# Patient Record
Sex: Female | Born: 1975
Health system: Southern US, Community
[De-identification: ages and names within clinical notes are randomized; demographics above are authoritative.]

## PROBLEM LIST (undated history)

## (undated) DIAGNOSIS — Z789 Other specified health status: Secondary | ICD-10-CM

## (undated) HISTORY — DX: Other specified health status: Z78.9

## (undated) HISTORY — PX: NO PAST SURGERIES: SHX2092

---

## 2018-04-06 LAB — HM MAMMOGRAPHY

## 2018-10-25 DIAGNOSIS — C44712 Basal cell carcinoma of skin of right lower limb, including hip: Secondary | ICD-10-CM | POA: Diagnosis not present

## 2018-10-25 DIAGNOSIS — L820 Inflamed seborrheic keratosis: Secondary | ICD-10-CM | POA: Diagnosis not present

## 2018-10-25 DIAGNOSIS — C441121 Basal cell carcinoma of skin of right upper eyelid, including canthus: Secondary | ICD-10-CM | POA: Diagnosis not present

## 2018-10-25 DIAGNOSIS — D485 Neoplasm of uncertain behavior of skin: Secondary | ICD-10-CM | POA: Diagnosis not present

## 2018-11-29 DIAGNOSIS — D2271 Melanocytic nevi of right lower limb, including hip: Secondary | ICD-10-CM | POA: Diagnosis not present

## 2018-11-29 DIAGNOSIS — D225 Melanocytic nevi of trunk: Secondary | ICD-10-CM | POA: Diagnosis not present

## 2018-11-29 DIAGNOSIS — D2371 Other benign neoplasm of skin of right lower limb, including hip: Secondary | ICD-10-CM | POA: Diagnosis not present

## 2018-11-29 DIAGNOSIS — C44712 Basal cell carcinoma of skin of right lower limb, including hip: Secondary | ICD-10-CM | POA: Diagnosis not present

## 2018-11-29 DIAGNOSIS — C441122 Basal cell carcinoma of skin of right lower eyelid, including canthus: Secondary | ICD-10-CM | POA: Diagnosis not present

## 2019-07-28 DIAGNOSIS — Z85828 Personal history of other malignant neoplasm of skin: Secondary | ICD-10-CM | POA: Insufficient documentation

## 2019-07-28 DIAGNOSIS — C441122 Basal cell carcinoma of skin of right lower eyelid, including canthus: Secondary | ICD-10-CM | POA: Diagnosis not present

## 2020-08-20 NOTE — Progress Notes (Signed)
New patient visit   Patient: Sarah Mathis   DOB: 1976/02/11   44 y.o. Female  MRN: [id number] Visit Date: 08/21/2020  Today's healthcare provider: Marcille Buffy, FNP   Chief Complaint  Patient presents with  . New Patient (Initial Visit)   Subjective    Sarah Mathis is a 44 y.o. female who presents today as a new patient to establish care.  HPI  Patient reports that she has moved to [facility] from [place], she states that she feels well today and has no concerns to address. Patient reports that she follows a well balanced diet and exercises 7x a week. Patient states sleep patterns are well sleeping average 6-8hrs a night.   Overdue for mammogram. Denies any previous abnormal's. Records request obtained.   Discussed colonoscopy and age 32 years old change in guideline, not interested at this time. Information and pamphlet given on Cologuard, Declines order for either at this time. Discussed risk versus benefits.   Overdue for PAP she believes. She does have IUD.  Records requested.   Blood pressure incidentally low end normal at visit today, denies any associated symptoms - she is fasting and not had anything to eat or drink this morning. Denies any history other than occasionally lower blood pressure with fasting and fluid restrictions.  Denies any vaginal bleeding, not having menstruals with IUD.  Denies any rectal bleeding or pain.  Patient  denies any fever, body aches,chills, rash, chest pain, shortness of breath, nausea, vomiting, or diarrhea.  Denies dizziness, lightheadedness, pre syncopal or syncopal episodes.   Up to date on yearly eye exam.   Desires influenza vaccine today.  Had Pfizer vaccine for covid second dose was three months ago.  Has two children 54 and 13. Has lived in [place] for two years.   History of basal cell carcinoma right side of face outer corner of eye has been removed., sees dermatology for regular skin checks.   She denies any other concerns.     History reviewed. No pertinent past medical history.  Family Status  Relation Name Status  . Father  (Not Specified)  . Mat Aunt  (Not Specified)   Family History  Problem Relation Age of Onset  . Other Father 54       Pacemaker, Triple Bypass  . Breast cancer Maternal Aunt    Social History   Socioeconomic History  . Marital status: Married    Spouse name: Not on file  . Number of children: Not on file  . Years of education: Not on file  . Highest education level: Not on file  Occupational History  . Not on file  Tobacco Use  . Smoking status: Never Smoker  . Smokeless tobacco: Never Used  Substance and Sexual Activity  . Alcohol use: Not Currently  . Drug use: Never  . Sexual activity: Not on file  Other Topics Concern  . Not on file  Social History Narrative  . Not on file   Social Determinants of Health   Financial Resource Strain:   . Difficulty of Paying Living Expenses: Not on file  Food Insecurity:   . Worried About Charity fundraiser in the Last Year: Not on file  . Ran Out of Food in the Last Year: Not on file  Transportation Needs:   . Lack of Transportation (Medical): Not on file  . Lack of Transportation (Non-Medical): Not on file  Physical Activity:   . Days of Exercise per Week: Not on  file  . Minutes of Exercise per Session: Not on file  Stress:   . Feeling of Stress : Not on file  Social Connections:   . Frequency of Communication with Friends and Family: Not on file  . Frequency of Social Gatherings with Friends and Family: Not on file  . Attends Religious Services: Not on file  . Active Member of Clubs or Organizations: Not on file  . Attends Archivist Meetings: Not on file  . Marital Status: Not on file   No outpatient medications prior to visit.   No facility-administered medications prior to visit.   No Known Allergies  Immunization History  Administered Date(s) Administered  . Influenza,inj,Quad PF,6+ Mos  08/21/2020    Health Maintenance  Topic Date Due  . Hepatitis C Screening  Never done  . COVID-19 Vaccine (1) Never done  . HIV Screening  Never done  . TETANUS/TDAP  Never done  . PAP SMEAR-Modifier  Never done  . INFLUENZA VACCINE  Completed    Patient Care Team: Vernard Gram, Kelby Aline, FNP as PCP - General (Family Medicine)  Review of Systems  Constitutional: Negative.   HENT: Negative.   Eyes: Negative.   Respiratory: Negative.   Cardiovascular: Negative.   Gastrointestinal: Negative.   Endocrine: Negative.   Genitourinary: Negative.   Musculoskeletal: Negative.   Skin: Negative.   Neurological: Negative.   Hematological: Negative.   Psychiatric/Behavioral: Negative.   All other systems reviewed and are negative.   Last CBC No results found for: WBC, HGB, HCT, MCV, MCH, RDW, PLT Last metabolic panel No results found for: GLUCOSE, NA, K, CL, CO2, BUN, CREATININE, GFRNONAA, GFRAA, CALCIUM, PHOS, PROT, ALBUMIN, LABGLOB, AGRATIO, BILITOT, ALKPHOS, AST, ALT, ANIONGAP Last lipids No results found for: CHOL, HDL, LDLCALC, LDLDIRECT, TRIG, CHOLHDL Last hemoglobin A1c No results found for: HGBA1C Last thyroid functions No results found for: TSH, T3TOTAL, T4TOTAL, THYROIDAB Last vitamin D No results found for: 25OHVITD2, 25OHVITD3, VD25OH Last vitamin B12 and Folate No results found for: VITAMINB12, FOLATE    Objective    BP 99/69   Pulse 67   Temp 98.8 F (37.1 C) (Oral)   Resp 15   Ht 5' 6"  (1.676 m)   Wt 175 lb 3.2 oz (79.5 kg)   SpO2 100%   BMI 28.28 kg/m  Physical Exam Vitals reviewed.  Constitutional:      General: She is not in acute distress.    Appearance: She is well-developed. She is not diaphoretic.     Interventions: She is not intubated. HENT:     Head: Normocephalic and atraumatic.     Right Ear: External ear normal.     Left Ear: External ear normal.     Nose: Nose normal.     Mouth/Throat:     Mouth: Mucous membranes are moist.      Pharynx: No oropharyngeal exudate or posterior oropharyngeal erythema.  Eyes:     General: Lids are normal. No scleral icterus.       Right eye: No discharge.        Left eye: No discharge.     Conjunctiva/sclera: Conjunctivae normal.     Right eye: Right conjunctiva is not injected. No exudate or hemorrhage.    Left eye: Left conjunctiva is not injected. No exudate or hemorrhage.    Pupils: Pupils are equal, round, and reactive to light.  Neck:     Thyroid: No thyroid mass or thyromegaly.     Vascular: Normal carotid pulses. No  carotid bruit, hepatojugular reflux or JVD.     Trachea: Trachea and phonation normal. No tracheal tenderness or tracheal deviation.     Meningeal: Brudzinski's sign and Kernig's sign absent.  Cardiovascular:     Rate and Rhythm: Normal rate and regular rhythm.     Pulses: Normal pulses.          Radial pulses are 2+ on the right side and 2+ on the left side.       Dorsalis pedis pulses are 2+ on the right side and 2+ on the left side.       Posterior tibial pulses are 2+ on the right side and 2+ on the left side.     Heart sounds: Normal heart sounds, S1 normal and S2 normal. Heart sounds not distant. No murmur heard.  No friction rub. No gallop.   Pulmonary:     Effort: Pulmonary effort is normal. No tachypnea, bradypnea, accessory muscle usage or respiratory distress. She is not intubated.     Breath sounds: Normal breath sounds. No stridor. No wheezing or rales.  Chest:     Chest wall: No tenderness.  Abdominal:     General: Bowel sounds are normal. There is no distension or abdominal bruit.     Palpations: Abdomen is soft. There is no shifting dullness, fluid wave, hepatomegaly, splenomegaly, mass or pulsatile mass.     Tenderness: There is no abdominal tenderness. There is no guarding or rebound.     Hernia: No hernia is present.  Musculoskeletal:        General: No tenderness or deformity. Normal range of motion.     Cervical back: Full passive range  of motion without pain, normal range of motion and neck supple. No edema, erythema or rigidity. No spinous process tenderness or muscular tenderness. Normal range of motion.  Lymphadenopathy:     Head:     Right side of head: No submental, submandibular, tonsillar, preauricular, posterior auricular or occipital adenopathy.     Left side of head: No submental, submandibular, tonsillar, preauricular, posterior auricular or occipital adenopathy.     Cervical: No cervical adenopathy.     Right cervical: No superficial, deep or posterior cervical adenopathy.    Left cervical: No superficial, deep or posterior cervical adenopathy.     Upper Body:     Right upper body: No supraclavicular or pectoral adenopathy.     Left upper body: No supraclavicular or pectoral adenopathy.  Skin:    General: Skin is warm and dry.     Capillary Refill: Capillary refill takes less than 2 seconds.     Coloration: Skin is not pale.     Findings: No abrasion, bruising, burn, ecchymosis, erythema, lesion, petechiae or rash.     Nails: There is no clubbing.  Neurological:     Mental Status: She is alert and oriented to person, place, and time.     GCS: GCS eye subscore is 4. GCS verbal subscore is 5. GCS motor subscore is 6.     Cranial Nerves: No cranial nerve deficit.     Sensory: No sensory deficit.     Motor: No tremor, atrophy, abnormal muscle tone or seizure activity.     Coordination: Coordination normal.     Gait: Gait normal.     Deep Tendon Reflexes: Reflexes are normal and symmetric. Reflexes normal. Babinski sign absent on the right side. Babinski sign absent on the left side.     Reflex Scores:  Tricep reflexes are 2+ on the right side and 2+ on the left side.      Bicep reflexes are 2+ on the right side and 2+ on the left side.      Brachioradialis reflexes are 2+ on the right side and 2+ on the left side.      Patellar reflexes are 2+ on the right side and 2+ on the left side.      Achilles  reflexes are 2+ on the right side and 2+ on the left side. Psychiatric:        Speech: Speech normal.        Behavior: Behavior normal.        Thought Content: Thought content normal.        Judgment: Judgment normal.     Depression Screen PHQ 2/9 Scores 08/21/2020  PHQ - 2 Score 0  PHQ- 9 Score 0   Results for orders placed or performed in visit on 08/21/20  POCT urinalysis dipstick  Result Value Ref Range   Color, UA yellow    Clarity, UA clear    Glucose, UA Negative Negative   Bilirubin, UA negative    Ketones, UA negative    Spec Grav, UA <=1.005 (A) 1.010 - 1.025   Blood, UA negative    pH, UA 8.0 5.0 - 8.0   Protein, UA Negative Negative   Urobilinogen, UA 0.2 0.2 or 1.0 E.U./dL   Nitrite, UA negative    Leukocytes, UA Negative Negative   Appearance     Odor      Assessment & Plan       1. Routine health maintenance  - CBC with Differential/Platelet - Comprehensive metabolic panel - Lipid panel - TSH - Ambulatory referral to Obstetrics / Gynecology  2. Screening for blood or protein in urine Urine negative for blood and protein.  - POCT urinalysis dipstick  3. Need for influenza vaccination VIS given . Vaccine given by Jimmie Molly. CMA. Possible side effects discussed. Aftercare discussed.  - Flu Vaccine QUAD 36+ mos IM  4. Breast cancer screening by mammogram Ordered given Cecil to call to schedule. Records requested for previous.  - MM Digital Screening  5. Body mass index 28.0-28.9, adult   Increase hydration. Monitor blood pressure at home, if low readings report or if any symptoms.   The patient is advised to begin progressive daily aerobic exercise program, follow a low fat, low cholesterol diet, attempt to lose weight, improve dietary compliance, use calcium 1 gram daily with Vit D, continue current medications, continue current healthy lifestyle patterns and return for routine annual checkups.  Return in about 3 months  (around 11/20/2020), or if symptoms worsen or fail to improve, for at any time for any worsening symptoms, Go to Emergency room/ urgent care if worse.     IWellington Hampshire Gage Treiber, FNP, have reviewed all documentation for this visit. The documentation on 08/21/20 for the exam, diagnosis, procedures, and orders are all accurate and complete.    Marcille Buffy, [place] [phone] (phone) [phone] (fax)  [place]

## 2020-08-21 ENCOUNTER — Ambulatory Visit (INDEPENDENT_AMBULATORY_CARE_PROVIDER_SITE_OTHER): Payer: BC Managed Care – PPO | Admitting: Adult Health

## 2020-08-21 ENCOUNTER — Encounter: Payer: Self-pay | Admitting: Adult Health

## 2020-08-21 ENCOUNTER — Other Ambulatory Visit: Payer: Self-pay

## 2020-08-21 VITALS — BP 99/69 | HR 67 | Temp 98.8°F | Resp 15 | Ht 66.0 in | Wt 175.2 lb

## 2020-08-21 DIAGNOSIS — Z1389 Encounter for screening for other disorder: Secondary | ICD-10-CM

## 2020-08-21 DIAGNOSIS — Z1231 Encounter for screening mammogram for malignant neoplasm of breast: Secondary | ICD-10-CM

## 2020-08-21 DIAGNOSIS — Z0000 Encounter for general adult medical examination without abnormal findings: Secondary | ICD-10-CM

## 2020-08-21 DIAGNOSIS — Z23 Encounter for immunization: Secondary | ICD-10-CM | POA: Insufficient documentation

## 2020-08-21 DIAGNOSIS — Z6828 Body mass index (BMI) 28.0-28.9, adult: Secondary | ICD-10-CM | POA: Insufficient documentation

## 2020-08-21 LAB — POCT URINALYSIS DIPSTICK
Bilirubin, UA: NEGATIVE
Blood, UA: NEGATIVE
Glucose, UA: NEGATIVE
Ketones, UA: NEGATIVE
Leukocytes, UA: NEGATIVE
Nitrite, UA: NEGATIVE
Protein, UA: NEGATIVE
Spec Grav, UA: <=1.005 — AB (ref 1.010–1.025)
Urobilinogen, UA: 0.2 E.U./dL
pH, UA: 8 (ref 5.0–8.0)

## 2020-08-21 NOTE — Patient Instructions (Addendum)
Call to schedule your screening mammogram. Your orders have been placed for your exam.  Let our office know if you have questions, concerns, or any difficulty scheduling.  If normal results then yearly screening mammograms are recommended unless you notice  Changes in your breast then you should schedule a follow up office visit. If abnormal results  Further imaging will be warranted and sooner follow up as determined by the radiologist at the [facility].   [facility] at [place], [place] [address]   Discussed health benefits of physical activity, and encouraged her to engage in regular exercise appropriate for her age and condition.   The patient is advised to attempt to lose weight, improve dietary compliance, use calcium 1 gram daily with Vit D and continue current healthy lifestyle patterns.   Main: [phone]     Health Maintenance, Female Adopting a healthy lifestyle and getting preventive care are important in promoting health and wellness. Ask your health care provider about:  The right schedule for you to have regular tests and exams.  Things you can do on your own to prevent diseases and keep yourself healthy. What should I know about diet, weight, and exercise? Eat a healthy diet   Eat a diet that includes plenty of vegetables, fruits, low-fat dairy products, and lean protein.  Do not eat a lot of foods that are high in solid fats, added sugars, or sodium. Maintain a healthy weight Body mass index (BMI) is used to identify weight problems. It estimates body fat based on height and weight. Your health care provider can help determine your BMI and help you achieve or maintain a healthy weight. Get regular exercise Get regular exercise. This is one of the most important things you can do for your health. Most adults should:  Exercise for at least 150 minutes each week. The exercise should increase your heart rate and  make you sweat (moderate-intensity exercise).  Do strengthening exercises at least twice a week. This is in addition to the moderate-intensity exercise.  Spend less time sitting. Even light physical activity can be beneficial. Watch cholesterol and blood lipids Have your blood tested for lipids and cholesterol at 45 years of age, then have this test every 5 years. Have your cholesterol levels checked more often if:  Your lipid or cholesterol levels are high.  You are older than 45 years of age.  You are at high risk for heart disease. What should I know about cancer screening? Depending on your health history and family history, you may need to have cancer screening at various ages. This may include screening for:  Breast cancer.  Cervical cancer.  Colorectal cancer.  Skin cancer.  Lung cancer. What should I know about heart disease, diabetes, and high blood pressure? Blood pressure and heart disease  High blood pressure causes heart disease and increases the risk of stroke. This is more likely to develop in people who have high blood pressure readings, are of African descent, or are overweight.  Have your blood pressure checked: ? Every 3-5 years if you are 32-46 years of age. ? Every year if you are 4 years old or older. Diabetes Have regular diabetes screenings. This checks your fasting blood sugar level. Have the screening done:  Once every three years after age 87 if you are at a normal weight and have a low risk for diabetes.  More often and at a younger age if you are overweight or  have a high risk for diabetes. What should I know about preventing infection? Hepatitis B If you have a higher risk for hepatitis B, you should be screened for this virus. Talk with your health care provider to find out if you are at risk for hepatitis B infection. Hepatitis C Testing is recommended for:  Everyone born from [birth year] through 1966.  Anyone with known risk factors for  hepatitis C. Sexually transmitted infections (STIs)  Get screened for STIs, including gonorrhea and chlamydia, if: ? You are sexually active and are younger than 45 years of age. ? You are older than 45 years of age and your health care provider tells you that you are at risk for this type of infection. ? Your sexual activity has changed since you were last screened, and you are at increased risk for chlamydia or gonorrhea. Ask your health care provider if you are at risk.  Ask your health care provider about whether you are at high risk for HIV. Your health care provider may recommend a prescription medicine to help prevent HIV infection. If you choose to take medicine to prevent HIV, you should first get tested for HIV. You should then be tested every 3 months for as long as you are taking the medicine. Pregnancy  If you are about to stop having your period (premenopausal) and you may become pregnant, seek counseling before you get pregnant.  Take 400 to 800 micrograms (mcg) of folic acid every day if you become pregnant.  Ask for birth control (contraception) if you want to prevent pregnancy. Osteoporosis and menopause Osteoporosis is a disease in which the bones lose minerals and strength with aging. This can result in bone fractures. If you are 13 years old or older, or if you are at risk for osteoporosis and fractures, ask your health care provider if you should:  Be screened for bone loss.  Take a calcium or vitamin D supplement to lower your risk of fractures.  Be given hormone replacement therapy (HRT) to treat symptoms of menopause. Follow these instructions at home: Lifestyle  Do not use any products that contain nicotine or tobacco, such as cigarettes, e-cigarettes, and chewing tobacco. If you need help quitting, ask your health care provider.  Do not use street drugs.  Do not share needles.  Ask your health care provider for help if you need support or information about  quitting drugs. Alcohol use  Do not drink alcohol if: ? Your health care provider tells you not to drink. ? You are pregnant, may be pregnant, or are planning to become pregnant.  If you drink alcohol: ? Limit how much you use to 0-1 drink a day. ? Limit intake if you are breastfeeding.  Be aware of how much alcohol is in your drink. In the [place] one drink equals one 12 oz bottle of beer (355 mL), one 5 oz glass of wine (148 mL), or one 1 oz glass of hard liquor (44 mL). General instructions  Schedule regular health, dental, and eye exams.  Stay current with your vaccines.  Tell your health care provider if: ? You often feel depressed. ? You have ever been abused or do not feel safe at home. Summary  Adopting a healthy lifestyle and getting preventive care are important in promoting health and wellness.  Follow your health care provider's instructions about healthy diet, exercising, and getting tested or screened for diseases.  Follow your health care provider's instructions on monitoring your cholesterol and blood pressure.  This information is not intended to replace advice given to you by your health care provider. Make sure you discuss any questions you have with your health care provider. Document Revised: 11/10/2018 Document Reviewed: 11/10/2018 Elsevier Patient Education  [place] and Cholesterol Restricted Eating Plan Getting too much fat and cholesterol in your diet may cause health problems. Choosing the right foods helps keep your fat and cholesterol at normal levels. This can keep you from getting certain diseases. Your doctor may recommend an eating plan that includes:  Total fat: ______% or less of total calories a day.  Saturated fat: ______% or less of total calories a day.  Cholesterol: less than _________mg a day.  Fiber: ______g a day. What are tips for following this plan? Meal planning  At meals, divide your plate into four equal  parts: ? Fill one-half of your plate with vegetables and green salads. ? Fill one-fourth of your plate with whole grains. ? Fill one-fourth of your plate with low-fat (lean) protein foods.  Eat fish that is high in omega-3 fats at least two times a week. This includes mackerel, tuna, sardines, and salmon.  Eat foods that are high in fiber, such as whole grains, beans, apples, broccoli, carrots, peas, and barley. General tips   Work with your doctor to lose weight if you need to.  Avoid: ? Foods with added sugar. ? Fried foods. ? Foods with partially hydrogenated oils.  Limit alcohol intake to no more than 1 drink a day for nonpregnant women and 2 drinks a day for men. One drink equals 12 oz of beer, 5 oz of wine, or 1 oz of hard liquor. Reading food labels  Check food labels for: ? Trans fats. ? Partially hydrogenated oils. ? Saturated fat (g) in each serving. ? Cholesterol (mg) in each serving. ? Fiber (g) in each serving.  Choose foods with healthy fats, such as: ? Monounsaturated fats. ? Polyunsaturated fats. ? Omega-3 fats.  Choose grain products that have whole grains. Look for the word "whole" as the first word in the ingredient list. Cooking  Cook foods using low-fat methods. These include baking, boiling, grilling, and broiling.  Eat more home-cooked foods. Eat at restaurants and buffets less often.  Avoid cooking using saturated fats, such as butter, cream, palm oil, palm kernel oil, and coconut oil. Recommended foods  Fruits  All fresh, canned (in natural juice), or frozen fruits. Vegetables  Fresh or frozen vegetables (raw, steamed, roasted, or grilled). Green salads. Grains  Whole grains, such as whole wheat or whole grain breads, crackers, cereals, and pasta. Unsweetened oatmeal, bulgur, barley, quinoa, or brown rice. Corn or whole wheat flour tortillas. Meats and other protein foods  Ground beef (85% or leaner), grass-fed beef, or beef trimmed of  fat. Skinless chicken or [place]. Ground chicken or [place]. Pork trimmed of fat. All fish and seafood. Egg whites. Dried beans, peas, or lentils. Unsalted nuts or seeds. Unsalted canned beans. Nut butters without added sugar or oil. Dairy  Low-fat or nonfat dairy products, such as skim or 1% milk, 2% or reduced-fat cheeses, low-fat and fat-free ricotta or cottage cheese, or plain low-fat and nonfat yogurt. Fats and oils  Tub margarine without trans fats. Light or reduced-fat mayonnaise and salad dressings. Avocado. Olive, canola, sesame, or safflower oils. The items listed above may not be a complete list of foods and beverages you can eat. Contact a dietitian for more information. Foods to avoid [employer]  fruit in heavy syrup. Fruit in cream or butter sauce. Fried fruit. Vegetables  Vegetables cooked in cheese, cream, or butter sauce. Fried vegetables. Grains  White bread. White pasta. White rice. Cornbread. Bagels, pastries, and croissants. Crackers and snack foods that contain trans fat and hydrogenated oils. Meats and other protein foods  Fatty cuts of meat. Ribs, chicken wings, bacon, sausage, bologna, salami, chitterlings, fatback, hot dogs, bratwurst, and packaged lunch meats. Liver and organ meats. Whole eggs and egg yolks. Chicken and [place] with skin. Fried meat. Dairy  Whole or 2% milk, cream, half-and-half, and cream cheese. Whole milk cheeses. Whole-fat or sweetened yogurt. Full-fat cheeses. Nondairy creamers and whipped toppings. Processed cheese, cheese spreads, and cheese curds. Beverages  Alcohol. Sugar-sweetened drinks such as sodas, lemonade, and fruit drinks. Fats and oils  Butter, stick margarine, lard, shortening, ghee, or bacon fat. Coconut, palm kernel, and palm oils. Sweets and desserts  Corn syrup, sugars, honey, and molasses. Candy. Jam and jelly. Syrup. Sweetened cereals. Cookies, pies, cakes, donuts, muffins, and ice cream. The items listed above may  not be a complete list of foods and beverages you should avoid. Contact a dietitian for more information. Summary  Choosing the right foods helps keep your fat and cholesterol at normal levels. This can keep you from getting certain diseases.  At meals, fill one-half of your plate with vegetables and green salads.  Eat high-fiber foods, like whole grains, beans, apples, carrots, peas, and barley.  Limit added sugar, saturated fats, alcohol, and fried foods. This information is not intended to replace advice given to you by your health care provider. Make sure you discuss any questions you have with your health care provider. Document Revised: 07/21/2018 Document Reviewed: 08/04/2017 Elsevier Patient Education  [place]. Influenza Virus Vaccine injection (Fluarix) What is this medicine? INFLUENZA VIRUS VACCINE (in floo EN zuh VAHY ruhs vak SEEN) helps to reduce the risk of getting influenza also known as the flu. This medicine may be used for other purposes; ask your health care provider or pharmacist if you have questions. COMMON BRAND NAME(S): Fluarix, Fluzone What should I tell my health care provider before I take this medicine? They need to know if you have any of these conditions:  bleeding disorder like hemophilia  fever or infection  Guillain-Barre syndrome or other neurological problems  immune system problems  infection with the human immunodeficiency virus (HIV) or AIDS  low blood platelet counts  multiple sclerosis  an unusual or allergic reaction to influenza virus vaccine, eggs, chicken proteins, latex, gentamicin, other medicines, foods, dyes or preservatives  pregnant or trying to get pregnant  breast-feeding How should I use this medicine? This vaccine is for injection into a muscle. It is given by a health care professional. A copy of Vaccine Information Statements will be given before each vaccination. Read this sheet carefully each time. The  sheet may change frequently. Talk to your pediatrician regarding the use of this medicine in children. Special care may be needed. Overdosage: If you think you have taken too much of this medicine contact a poison control center or emergency room at once. NOTE: This medicine is only for you. Do not share this medicine with others. What if I miss a dose? This does not apply. What may interact with this medicine?  chemotherapy or radiation therapy  medicines that lower your immune system like etanercept, anakinra, infliximab, and adalimumab  medicines that treat or prevent blood clots like warfarin  phenytoin  steroid medicines  like prednisone or cortisone  theophylline  vaccines This list may not describe all possible interactions. Give your health care provider a list of all the medicines, herbs, non-prescription drugs, or dietary supplements you use. Also tell them if you smoke, drink alcohol, or use illegal drugs. Some items may interact with your medicine. What should I watch for while using this medicine? Report any side effects that do not go away within 3 days to your doctor or health care professional. Call your health care provider if any unusual symptoms occur within 6 weeks of receiving this vaccine. You may still catch the flu, but the illness is not usually as bad. You cannot get the flu from the vaccine. The vaccine will not protect against colds or other illnesses that may cause fever. The vaccine is needed every year. What side effects may I notice from receiving this medicine? Side effects that you should report to your doctor or health care professional as soon as possible:  allergic reactions like skin rash, itching or hives, swelling of the face, lips, or tongue Side effects that usually do not require medical attention (report to your doctor or health care professional if they continue or are bothersome):  fever  headache  muscle aches and pains  pain,  tenderness, redness, or swelling at site where injected  weak or tired This list may not describe all possible side effects. Call your doctor for medical advice about side effects. You may report side effects to FDA at 1-800-FDA-1088. Where should I keep my medicine? This vaccine is only given in a clinic, pharmacy, doctor's office, or other health care setting and will not be stored at home. NOTE: This sheet is a summary. It may not cover all possible information. If you have questions about this medicine, talk to your doctor, pharmacist, or health care provider.  2020 Elsevier/Gold Standard (2008-06-14 09:30:40)

## 2020-08-22 LAB — CBC WITH DIFFERENTIAL/PLATELET
Basophils Absolute: 0.1 10*3/uL (ref 0.0–0.2)
Basos: 1 %
EOS (ABSOLUTE): 0.1 10*3/uL (ref 0.0–0.4)
Eos: 2 %
Hematocrit: 44.4 % (ref 34.0–46.6)
Hemoglobin: 14.6 g/dL (ref 11.1–15.9)
Immature Grans (Abs): 0 10*3/uL (ref 0.0–0.1)
Immature Granulocytes: 0 %
Lymphocytes Absolute: 1.6 10*3/uL (ref 0.7–3.1)
Lymphs: 35 %
MCH: 29.8 pg (ref 26.6–33.0)
MCHC: 32.9 g/dL (ref 31.5–35.7)
MCV: 91 fL (ref 79–97)
Monocytes Absolute: 0.6 10*3/uL (ref 0.1–0.9)
Monocytes: 12 %
Neutrophils Absolute: 2.2 10*3/uL (ref 1.4–7.0)
Neutrophils: 50 %
Platelets: 232 10*3/uL (ref 150–450)
RBC: 4.9 x10E6/uL (ref 3.77–5.28)
RDW: 12.3 % (ref 11.7–15.4)
WBC: 4.6 10*3/uL (ref 3.4–10.8)

## 2020-08-22 LAB — COMPREHENSIVE METABOLIC PANEL
ALT: 12 IU/L (ref 0–32)
AST: 12 IU/L (ref 0–40)
Albumin/Globulin Ratio: 1.9 (ref 1.2–2.2)
Albumin: 4.5 g/dL (ref 3.8–4.8)
Alkaline Phosphatase: 35 IU/L — ABNORMAL LOW (ref 44–121)
BUN/Creatinine Ratio: 14 (ref 9–23)
BUN: 10 mg/dL (ref 6–24)
Bilirubin Total: 0.6 mg/dL (ref 0.0–1.2)
CO2: 19 mmol/L — ABNORMAL LOW (ref 20–29)
Calcium: 8.7 mg/dL (ref 8.7–10.2)
Chloride: 102 mmol/L (ref 96–106)
Creatinine, Ser: 0.72 mg/dL (ref 0.57–1.00)
GFR calc Af Amer: 117 mL/min/{1.73_m2} (ref >59)
GFR calc non Af Amer: 101 mL/min/{1.73_m2} (ref >59)
Globulin, Total: 2.4 g/dL (ref 1.5–4.5)
Glucose: 87 mg/dL (ref 65–99)
Potassium: 4.4 mmol/L (ref 3.5–5.2)
Sodium: 136 mmol/L (ref 134–144)
Total Protein: 6.9 g/dL (ref 6.0–8.5)

## 2020-08-22 LAB — LIPID PANEL
Chol/HDL Ratio: 2.9 ratio (ref 0.0–4.4)
Cholesterol, Total: 194 mg/dL (ref 100–199)
HDL: 67 mg/dL (ref >39)
LDL Chol Calc (NIH): 113 mg/dL — ABNORMAL HIGH (ref 0–99)
Triglycerides: 75 mg/dL (ref 0–149)
VLDL Cholesterol Cal: 14 mg/dL (ref 5–40)

## 2020-08-22 LAB — TSH: TSH: 0.927 u[IU]/mL (ref 0.450–4.500)

## 2020-08-22 NOTE — Progress Notes (Signed)
CBC within normal limits no signs of anemia or infection.  CMP ok alkaline phosphatase mildly decreased likely normal will recheck CMP in 3 months.  LDL elevated.  Discuss lifestyle modification with patient e.g. increase exercise, fiber, fruits, vegetables, lean meat, and omega 3/fish intake and decrease saturated fat.  If patient following strict diet and exercise program already please schedule follow up appointment with primary care physician

## 2020-09-10 ENCOUNTER — Encounter: Payer: Self-pay | Admitting: Adult Health

## 2020-10-02 NOTE — Progress Notes (Addendum)
Flinchum, Sarah Aline, FNP   Chief Complaint  Patient presents with  . Pap Only  . IUD check    HPI:      [name] is a 44 y.o. No obstetric history on file. whose LMP was No LMP recorded. (Menstrual status: IUD)., presents today for NP pap and IUD check, referred by PCP. Mirena placed about 3 yrs ago. Pt is amenorrheic, no BTB/dysmen. She is sex active, no pain/bleeding. Last pap about 3 yrs ago was normal. No hx of abn pap with tx.  FH breast cancer in her mat aunt, genetic testing not indicated. Last mammo 5/19; has mammo ordered by PCP Pt aware of scr colonoscopy/cologuard options with PCP; declined both at that time Pt gets adequate calcium but not Vit D in her diet.   Past Medical History:  Diagnosis Date  . No pertinent past medical history     Past Surgical History:  Procedure Laterality Date  . NO PAST SURGERIES      Family History  Problem Relation Age of Onset  . Other Father 29       Pacemaker, Triple Bypass  . Breast cancer Maternal Aunt        27s    Social History   Socioeconomic History  . Marital status: Married    Spouse name: Not on file  . Number of children: Not on file  . Years of education: Not on file  . Highest education level: Not on file  Occupational History  . Not on file  Tobacco Use  . Smoking status: Never Smoker  . Smokeless tobacco: Never Used  Vaping Use  . Vaping Use: Never used  Substance and Sexual Activity  . Alcohol use: Not Currently  . Drug use: Never  . Sexual activity: Yes    Birth control/protection: I.U.D.    Comment: Mirena  Other Topics Concern  . Not on file  Social History Narrative  . Not on file   Social Determinants of Health   Financial Resource Strain:   . Difficulty of Paying Living Expenses: Not on file  Food Insecurity:   . Worried About Charity fundraiser in the Last Year: Not on file  . Ran Out of Food in the Last Year: Not on file  Transportation Needs:   . Lack of  Transportation (Medical): Not on file  . Lack of Transportation (Non-Medical): Not on file  Physical Activity:   . Days of Exercise per Week: Not on file  . Minutes of Exercise per Session: Not on file  Stress:   . Feeling of Stress : Not on file  Social Connections:   . Frequency of Communication with Friends and Family: Not on file  . Frequency of Social Gatherings with Friends and Family: Not on file  . Attends Religious Services: Not on file  . Active Member of Clubs or Organizations: Not on file  . Attends Archivist Meetings: Not on file  . Marital Status: Not on file  Intimate Partner Violence:   . Fear of Current or Ex-Partner: Not on file  . Emotionally Abused: Not on file  . Physically Abused: Not on file  . Sexually Abused: Not on file    Outpatient Medications Prior to Visit  Medication Sig Dispense Refill  . levonorgestrel (MIRENA) 20 MCG/24HR IUD 1 each by Intrauterine route once. Insertion October 2019     No facility-administered medications prior to visit.      ROS:  Review  of Systems  Constitutional: Negative for fatigue, fever and unexpected weight change.  Respiratory: Negative for cough, shortness of breath and wheezing.   Cardiovascular: Negative for chest pain, palpitations and leg swelling.  Gastrointestinal: Negative for blood in stool, constipation, diarrhea, nausea and vomiting.  Endocrine: Negative for cold intolerance, heat intolerance and polyuria.  Genitourinary: Negative for dyspareunia, dysuria, flank pain, frequency, genital sores, hematuria, menstrual problem, pelvic pain, urgency, vaginal bleeding, vaginal discharge and vaginal pain.  Musculoskeletal: Negative for back pain, joint swelling and myalgias.  Skin: Negative for rash.  Neurological: Negative for dizziness, syncope, light-headedness, numbness and headaches.  Hematological: Negative for adenopathy.  Psychiatric/Behavioral: Negative for agitation, confusion, sleep  disturbance and suicidal ideas. The patient is not nervous/anxious.   BREAST: No symptoms   OBJECTIVE:   Vitals:  BP 106/70   Ht 5' 6"  (1.676 m)   Wt 172 lb (78 kg)   BMI 27.76 kg/m   Physical Exam Vitals reviewed.  Constitutional:      Appearance: She is well-developed.  Pulmonary:     Effort: Pulmonary effort is normal.  Genitourinary:    General: Normal vulva.     Pubic Area: No rash.      Labia:        Right: No rash, tenderness or lesion.        Left: No rash, tenderness or lesion.      Vagina: Normal. No vaginal discharge, erythema or tenderness.     Cervix: Normal.     Uterus: Normal. Not enlarged and not tender.      Adnexa: Right adnexa normal and left adnexa normal.       Right: No mass or tenderness.         Left: No mass or tenderness.       Comments: IUD STRINGS IN CX OS Musculoskeletal:        General: Normal range of motion.     Cervical back: Normal range of motion.  Skin:    General: Skin is warm and dry.  Neurological:     General: No focal deficit present.     Mental Status: She is alert and oriented to person, place, and time.  Psychiatric:        Mood and Affect: Mood normal.        Behavior: Behavior normal.        Thought Content: Thought content normal.        Judgment: Judgment normal.     Assessment/Plan: Cervical cancer screening - Plan: Cytology - PAP  Screening for HPV (human papillomavirus) - Plan: Cytology - PAP  Encounter for routine checking of intrauterine contraceptive device (IUD)--IUD due for removal in 4 ys now.  Screening for colon cancer--colonoscopy/cologuard discussed. Pt declines for now.  Add Vit D supp   Return in about 1 year (around 10/03/2021).  Andras Grunewald B. Devesh Monforte, PA-C 10/03/2020 10:23 AM

## 2020-10-02 NOTE — Patient Instructions (Signed)
I value your feedback and entrusting [place] with your care. If you get a [facility] patient survey, I would appreciate you taking the time to let us know about your experience today. Thank you!  As of November 10, 2019, your lab results will be released to your MyChart immediately, before I even have a chance to see them. Please give me time to review them and contact you if there are any abnormalities. Thank you for your patience.

## 2020-10-03 ENCOUNTER — Encounter: Payer: Self-pay | Admitting: Obstetrics and Gynecology

## 2020-10-03 ENCOUNTER — Other Ambulatory Visit (HOSPITAL_COMMUNITY)
Admission: RE | Admit: 2020-10-03 | Discharge: 2020-10-03 | Disposition: A | Discharge status: Home / Self Care | Payer: BC Managed Care – PPO | Source: Ambulatory Visit | Attending: Obstetrics and Gynecology | Admitting: Obstetrics and Gynecology

## 2020-10-03 ENCOUNTER — Ambulatory Visit (INDEPENDENT_AMBULATORY_CARE_PROVIDER_SITE_OTHER): Payer: BC Managed Care – PPO | Admitting: Obstetrics and Gynecology

## 2020-10-03 ENCOUNTER — Other Ambulatory Visit: Payer: Self-pay

## 2020-10-03 VITALS — BP 106/70 | Ht 66.0 in | Wt 172.0 lb

## 2020-10-03 DIAGNOSIS — Z1151 Encounter for screening for human papillomavirus (HPV): Secondary | ICD-10-CM | POA: Insufficient documentation

## 2020-10-03 DIAGNOSIS — Z124 Encounter for screening for malignant neoplasm of cervix: Secondary | ICD-10-CM | POA: Insufficient documentation

## 2020-10-03 DIAGNOSIS — Z30431 Encounter for routine checking of intrauterine contraceptive device: Secondary | ICD-10-CM | POA: Diagnosis not present

## 2020-10-03 DIAGNOSIS — Z1211 Encounter for screening for malignant neoplasm of colon: Secondary | ICD-10-CM

## 2020-10-05 ENCOUNTER — Encounter: Payer: Self-pay | Admitting: Obstetrics and Gynecology

## 2020-10-05 LAB — CYTOLOGY - PAP
Comment: NEGATIVE
Diagnosis: UNDETERMINED — AB
High risk HPV: NEGATIVE

## 2021-01-07 ENCOUNTER — Other Ambulatory Visit: Payer: Self-pay

## 2021-01-07 ENCOUNTER — Ambulatory Visit
Admission: RE | Admit: 2021-01-07 | Discharge: 2021-01-07 | Disposition: A | Discharge status: Home / Self Care | Payer: BC Managed Care – PPO | Source: Ambulatory Visit | Attending: Adult Health | Admitting: Adult Health

## 2021-01-07 DIAGNOSIS — Z1231 Encounter for screening mammogram for malignant neoplasm of breast: Secondary | ICD-10-CM | POA: Diagnosis not present

## 2021-12-10 ENCOUNTER — Other Ambulatory Visit: Payer: Self-pay | Admitting: Adult Health

## 2021-12-10 DIAGNOSIS — Z1231 Encounter for screening mammogram for malignant neoplasm of breast: Secondary | ICD-10-CM

## 2022-01-14 ENCOUNTER — Other Ambulatory Visit: Payer: Self-pay

## 2022-01-14 ENCOUNTER — Ambulatory Visit
Admission: RE | Admit: 2022-01-14 | Discharge: 2022-01-14 | Disposition: A | Discharge status: Home / Self Care | Payer: BC Managed Care – PPO | Source: Ambulatory Visit | Attending: Adult Health | Admitting: Adult Health

## 2022-01-14 DIAGNOSIS — Z1231 Encounter for screening mammogram for malignant neoplasm of breast: Secondary | ICD-10-CM | POA: Diagnosis not present

## 2022-01-15 NOTE — Progress Notes (Signed)
FINDINGS: There are no findings suspicious for malignancy.  IMPRESSION: No mammographic evidence of malignancy. A result letter of this screening mammogram will be mailed directly to the patient.  RECOMMENDATION: Screening mammogram in one yea

## 2022-03-24 NOTE — Progress Notes (Addendum)
Addendum 04/17/2022: -In regards to [facility] therapy initiation: --Pt is not a candidate for any stimulant weight loss medication ie phentermine, diethylpropion, etc d/t her comorbidity of anxiety w/ physical symptoms like heart racing, palpitations. It is my medical opinion that stimulant medications would worsen this condition. --She is not a candidate for Contrave d/t her comorbidity of anxiety.  --She has actively been involved in several diet programs ie noom, optavia. She currently follows a low calorie diet and has increased her physical activity levels.   I,Sha'taria Tyson,acting as a Education administrator for [employer], PA-C.,have documented all relevant documentation on the behalf of Mikey Kirschner, PA-C,as directed by  Mikey Kirschner, PA-C while in the presence of Mikey Kirschner, PA-C.   Complete physical exam   Patient: Sarah Mathis   DOB: 02/07/1976   47 y.o. Female  MRN: [id number] Visit Date: 03/25/2022  Today's healthcare provider: Mikey Kirschner, PA-C   Cc. cpe  Subjective    Sarah Mathis is a 46 y.o. female who presents today for a complete physical exam.  She reports consuming a general and well balanced  diet.  The patient goes walking with strength/light weight exercises at least 6 days a week for 30 minutes to a hour with mild to moderate intensity.  She generally feels well. She reports sleeping well. She does have additional problems to discuss today.  HPI   Weight gain -15 pounds in the last year despite using various diets, diet plans, supplements including optavia, noom, following different strict diets, exercise. -IUD, no menstrual cycle 10+ years with IUD, unsure if in perimeopause  Work related anxiety -specifically around leading presentations, meetings. Will feel physical anxiety ie shaking, heart pounding, voice shaking   L arm pain/elbow, 2-3 weeks --denies injury, swelling ,erythema, rash, repetitive movements.    Past Medical History:  Diagnosis Date    No pertinent past medical history    Past Surgical History:  Procedure Laterality Date   NO PAST SURGERIES     Social History   Socioeconomic History   Marital status: Married    Spouse name: Not on file   Number of children: Not on file   Years of education: Not on file   Highest education level: Not on file  Occupational History   Not on file  Tobacco Use   Smoking status: Never   Smokeless tobacco: Never  Vaping Use   Vaping Use: Never used  Substance and Sexual Activity   Alcohol use: Not Currently   Drug use: Never   Sexual activity: Yes    Birth control/protection: I.U.D.    Comment: Mirena  Other Topics Concern   Not on file  Social History Narrative   Not on file   Social Determinants of Health   Financial Resource Strain: Not on file  Food Insecurity: Not on file  Transportation Needs: Not on file  Physical Activity: Not on file  Stress: Not on file  Social Connections: Not on file  Intimate Partner Violence: Not on file   Family Status  Relation Name Status   Father  Alive   Mat Aunt  Alive   Family History  Problem Relation Age of Onset   Other Father 80       Pacemaker, Triple Bypass   Breast cancer Maternal Aunt        59s   No Known Allergies  Patient Care Team: Mikey Kirschner, PA-C as PCP - General (Physician Assistant)   Medications: Outpatient Medications Prior to Visit  Medication Sig  levonorgestrel (MIRENA) 20 MCG/24HR IUD 1 each by Intrauterine route once. Insertion October 2019   No facility-administered medications prior to visit.    Review of Systems  Constitutional:  Positive for unexpected weight change.  Musculoskeletal:  Positive for arthralgias.  Psychiatric/Behavioral:  The patient is nervous/anxious.      Objective     Blood pressure 129/80, pulse 78, temperature 99.3 F (37.4 C), temperature source Oral, height 5' 6"  (1.676 m), weight 191 lb (86.6 kg), SpO2 100 %.    Physical Exam Constitutional:       General: She is awake.     Appearance: She is well-developed. She is not ill-appearing.  HENT:     Head: Normocephalic.     Right Ear: Tympanic membrane normal.     Left Ear: Tympanic membrane normal.     Nose: Nose normal. No congestion or rhinorrhea.     Mouth/Throat:     Pharynx: No oropharyngeal exudate or posterior oropharyngeal erythema.  Eyes:     Conjunctiva/sclera: Conjunctivae normal.     Pupils: Pupils are equal, round, and reactive to light.  Neck:     Thyroid: No thyroid mass or thyromegaly.  Cardiovascular:     Rate and Rhythm: Normal rate and regular rhythm.     Heart sounds: Normal heart sounds.  Pulmonary:     Effort: Pulmonary effort is normal.     Breath sounds: Normal breath sounds.  Abdominal:     Palpations: Abdomen is soft.     Tenderness: There is no abdominal tenderness.  Musculoskeletal:     Left elbow: Normal. No swelling or deformity.     Right lower leg: No swelling. No edema.     Left lower leg: No swelling. No edema.     Comments: Some tenderness to ant. Forearm muscles are elbow. No epicondylar tenderness  Lymphadenopathy:     Cervical: No cervical adenopathy.  Skin:    General: Skin is warm.  Neurological:     Mental Status: She is alert and oriented to person, place, and time.  Psychiatric:        Attention and Perception: Attention normal.        Mood and Affect: Mood normal.        Speech: Speech normal.        Behavior: Behavior normal. Behavior is cooperative.     Last depression screening scores    03/25/2022   11:11 AM 08/21/2020    8:23 AM  PHQ 2/9 Scores  PHQ - 2 Score 0 0  PHQ- 9 Score 0 0   Last fall risk screening    03/25/2022   11:11 AM  Muir in the past year? 0  Number falls in past yr: 0  Injury with Fall? 0  Risk for fall due to : No Fall Risks   Last Audit-C alcohol use screening    03/25/2022   11:11 AM  Alcohol Use Disorder Test (AUDIT)  1. How often do you have a drink containing alcohol?  4  2. How many drinks containing alcohol do you have on a typical day when you are drinking? 0  3. How often do you have six or more drinks on one occasion? 1  AUDIT-C Score 5   A score of 3 or more in women, and 4 or more in men indicates increased risk for alcohol abuse, EXCEPT if all of the points are from question 1   No results found for any visits on  03/25/22.  Assessment & Plan    Routine Health Maintenance and Physical Exam  Exercise Activities and Dietary recommendations --balanced diet high in fiber and protein, low in sugars, carbs, fats. --physical activity/exercise 30 minutes 3-5 times a week     Immunization History  Administered Date(s) Administered   Influenza,inj,Quad PF,6+ Mos 08/21/2020   PFIZER(Purple Top)SARS-COV-2 Vaccination 03/04/2020, 04/01/2020, 11/18/2020   Pfizer Covid-19 Vaccine Bivalent Booster 28yr & up 08/31/2021   Td 03/25/2022    Health Maintenance  Topic Date Due   COLONOSCOPY (Pts 45-453yrInsurance coverage will need to be confirmed)  Never done   Hepatitis C Screening  03/26/2023 (Originally 04/23/1993)   HIV Screening  03/26/2023 (Originally 04/23/1990)   INFLUENZA VACCINE  07/01/2022   MAMMOGRAM  01/14/2023   PAP SMEAR-Modifier  10/04/2023   TETANUS/TDAP  03/25/2032   COVID-19 Vaccine  Completed   HPV VACCINES  Aged Out    Discussed health benefits of physical activity, and encouraged her to engage in regular exercise appropriate for her age and condition.  Problem List Items Addressed This Visit       Other   BMI 30.0-30.9,adult    Discussed different options for weight loss Discussed wegovy -- type of medication, MOA, side effects. Pt would like to try.  Advised she meet with a nutritionist/dietician as well       Relevant Medications   Semaglutide-Weight Management (WEGOVY) 0.25 MG/0.5ML SOAJ   Other Relevant Orders   Comprehensive Metabolic Panel (CMET)   CBC w/Diff/Platelet   HgB A1c   Lipid Profile   TSH + free T4    Amb ref to Medical Nutrition Therapy-MNT   Performance anxiety    Discussed meditation ; propranolol prn presentations at work Can start with taking 1/2 dose, propranolol 10 mg but can take 5 mg at a time       Relevant Medications   propranolol (INDERAL) 10 MG tablet   Left arm pain    Muscle tension/strain Advised heat, massage       Other Visit Diagnoses     Encounter for health maintenance examination    -  Primary   Relevant Orders   Comprehensive Metabolic Panel (CMET)   CBC w/Diff/Platelet   HgB A1c   Lipid Profile   TSH + free T4   Screening for malignant neoplasm of colon       Relevant Orders   Cologuard   Need for Td vaccine       Relevant Orders   Td : Tetanus/diphtheria >7yo Preservative  free (Completed)        Return in about 8 weeks (around 05/20/2022) for weight Management.     I, LiMikey KirschnerPA-C have reviewed all documentation for this visit. The documentation on  03/25/2022 for the exam, diagnosis, procedures, and orders are all accurate and complete.  LiMikey KirschnerPA-C [facility] BuSan [id number]: [phone]: 33[place]

## 2022-03-25 ENCOUNTER — Encounter: Payer: Self-pay | Admitting: Physician Assistant

## 2022-03-25 ENCOUNTER — Ambulatory Visit (INDEPENDENT_AMBULATORY_CARE_PROVIDER_SITE_OTHER): Payer: BC Managed Care – PPO | Admitting: Physician Assistant

## 2022-03-25 VITALS — BP 129/80 | HR 78 | Temp 99.3°F | Ht 66.0 in | Wt 191.0 lb

## 2022-03-25 DIAGNOSIS — Z23 Encounter for immunization: Secondary | ICD-10-CM | POA: Diagnosis not present

## 2022-03-25 DIAGNOSIS — M79602 Pain in left arm: Secondary | ICD-10-CM

## 2022-03-25 DIAGNOSIS — Z0000 Encounter for general adult medical examination without abnormal findings: Secondary | ICD-10-CM

## 2022-03-25 DIAGNOSIS — F418 Other specified anxiety disorders: Secondary | ICD-10-CM

## 2022-03-25 DIAGNOSIS — Z1211 Encounter for screening for malignant neoplasm of colon: Secondary | ICD-10-CM

## 2022-03-25 DIAGNOSIS — Z6830 Body mass index (BMI) 30.0-30.9, adult: Secondary | ICD-10-CM

## 2022-03-25 MED ORDER — PROPRANOLOL HCL 10 MG PO TABS: 5.0000 mg | ORAL_TABLET | Freq: Three times a day (TID) | ORAL | 1 refills | Status: DC | PRN

## 2022-03-25 MED ORDER — WEGOVY 0.25 MG/0.5ML ~~LOC~~ SOAJ: 0.2500 mg | SUBCUTANEOUS | 0 refills | Status: DC

## 2022-03-25 NOTE — Assessment & Plan Note (Signed)
Discussed meditation ; propranolol prn presentations at work ?Can start with taking 1/2 dose, propranolol 10 mg but can take 5 mg at a time ?

## 2022-03-25 NOTE — Assessment & Plan Note (Signed)
Discussed different options for weight loss ?Discussed wegovy -- type of medication, MOA, side effects. Pt would like to try.  ?Advised she meet with a nutritionist/dietician as well ?

## 2022-03-25 NOTE — Assessment & Plan Note (Signed)
Muscle tension/strain ?Advised heat, massage ?

## 2022-04-17 ENCOUNTER — Encounter: Payer: Self-pay | Admitting: Physician Assistant

## 2022-04-21 DIAGNOSIS — Z6830 Body mass index (BMI) 30.0-30.9, adult: Secondary | ICD-10-CM | POA: Diagnosis not present

## 2022-04-21 DIAGNOSIS — Z0000 Encounter for general adult medical examination without abnormal findings: Secondary | ICD-10-CM | POA: Diagnosis not present

## 2022-04-21 DIAGNOSIS — E669 Obesity, unspecified: Secondary | ICD-10-CM | POA: Diagnosis not present

## 2022-04-22 ENCOUNTER — Encounter: Payer: Self-pay | Admitting: Physician Assistant

## 2022-04-22 LAB — COMPREHENSIVE METABOLIC PANEL
ALT: 20 IU/L (ref 0–32)
AST: 17 IU/L (ref 0–40)
Albumin/Globulin Ratio: 1.5 (ref 1.2–2.2)
Albumin: 4.2 g/dL (ref 3.8–4.8)
Alkaline Phosphatase: 37 IU/L — ABNORMAL LOW (ref 44–121)
BUN/Creatinine Ratio: 14 (ref 9–23)
BUN: 12 mg/dL (ref 6–24)
Bilirubin Total: 0.5 mg/dL (ref 0.0–1.2)
CO2: 21 mmol/L (ref 20–29)
Calcium: 9.1 mg/dL (ref 8.7–10.2)
Chloride: 100 mmol/L (ref 96–106)
Creatinine, Ser: 0.84 mg/dL (ref 0.57–1.00)
Globulin, Total: 2.8 g/dL (ref 1.5–4.5)
Glucose: 92 mg/dL (ref 70–99)
Potassium: 4.8 mmol/L (ref 3.5–5.2)
Sodium: 135 mmol/L (ref 134–144)
Total Protein: 7 g/dL (ref 6.0–8.5)
eGFR: 87 mL/min/{1.73_m2} (ref >59)

## 2022-04-22 LAB — CBC WITH DIFFERENTIAL/PLATELET
Basophils Absolute: 0.1 10*3/uL (ref 0.0–0.2)
Basos: 1 %
EOS (ABSOLUTE): 0.2 10*3/uL (ref 0.0–0.4)
Eos: 3 %
Hematocrit: 45.1 % (ref 34.0–46.6)
Hemoglobin: 14.8 g/dL (ref 11.1–15.9)
Immature Grans (Abs): 0 10*3/uL (ref 0.0–0.1)
Immature Granulocytes: 0 %
Lymphocytes Absolute: 1.8 10*3/uL (ref 0.7–3.1)
Lymphs: 33 %
MCH: 29.3 pg (ref 26.6–33.0)
MCHC: 32.8 g/dL (ref 31.5–35.7)
MCV: 89 fL (ref 79–97)
Monocytes Absolute: 0.7 10*3/uL (ref 0.1–0.9)
Monocytes: 12 %
Neutrophils Absolute: 2.9 10*3/uL (ref 1.4–7.0)
Neutrophils: 51 %
Platelets: 239 10*3/uL (ref 150–450)
RBC: 5.05 x10E6/uL (ref 3.77–5.28)
RDW: 12.8 % (ref 11.7–15.4)
WBC: 5.6 10*3/uL (ref 3.4–10.8)

## 2022-04-22 LAB — LIPID PANEL
Chol/HDL Ratio: 3 ratio (ref 0.0–4.4)
Cholesterol, Total: 212 mg/dL — ABNORMAL HIGH (ref 100–199)
HDL: 70 mg/dL (ref >39)
LDL Chol Calc (NIH): 127 mg/dL — ABNORMAL HIGH (ref 0–99)
Triglycerides: 83 mg/dL (ref 0–149)
VLDL Cholesterol Cal: 15 mg/dL (ref 5–40)

## 2022-04-22 LAB — HEMOGLOBIN A1C
Est. average glucose Bld gHb Est-mCnc: 103 mg/dL
Hgb A1c MFr Bld: 5.2 % (ref 4.8–5.6)

## 2022-04-22 LAB — TSH+FREE T4
Free T4: 1.5 ng/dL (ref 0.82–1.77)
TSH: 1.01 u[IU]/mL (ref 0.450–4.500)

## 2022-04-29 DIAGNOSIS — Z1211 Encounter for screening for malignant neoplasm of colon: Secondary | ICD-10-CM | POA: Diagnosis not present

## 2022-05-07 LAB — COLOGUARD: COLOGUARD: NEGATIVE

## 2022-05-20 ENCOUNTER — Ambulatory Visit: Payer: BC Managed Care – PPO | Admitting: Physician Assistant

## 2022-07-17 DIAGNOSIS — L02213 Cutaneous abscess of chest wall: Secondary | ICD-10-CM | POA: Diagnosis not present

## 2022-07-17 DIAGNOSIS — D2239 Melanocytic nevi of other parts of face: Secondary | ICD-10-CM | POA: Diagnosis not present

## 2022-07-17 DIAGNOSIS — D485 Neoplasm of uncertain behavior of skin: Secondary | ICD-10-CM | POA: Diagnosis not present

## 2022-07-30 DIAGNOSIS — Z131 Encounter for screening for diabetes mellitus: Secondary | ICD-10-CM | POA: Diagnosis not present

## 2022-07-30 DIAGNOSIS — N951 Menopausal and female climacteric states: Secondary | ICD-10-CM | POA: Diagnosis not present

## 2022-07-30 DIAGNOSIS — Z130 Encounter for screening for diseases of the blood and blood-forming organs and certain disorders involving the immune mechanism: Secondary | ICD-10-CM | POA: Diagnosis not present

## 2022-07-30 DIAGNOSIS — R635 Abnormal weight gain: Secondary | ICD-10-CM | POA: Diagnosis not present

## 2022-07-30 DIAGNOSIS — Z13220 Encounter for screening for lipoid disorders: Secondary | ICD-10-CM | POA: Diagnosis not present

## 2022-07-30 DIAGNOSIS — E663 Overweight: Secondary | ICD-10-CM | POA: Diagnosis not present

## 2022-07-30 DIAGNOSIS — R5382 Chronic fatigue, unspecified: Secondary | ICD-10-CM | POA: Diagnosis not present

## 2022-07-30 DIAGNOSIS — E559 Vitamin D deficiency, unspecified: Secondary | ICD-10-CM | POA: Diagnosis not present

## 2022-08-01 DIAGNOSIS — R454 Irritability and anger: Secondary | ICD-10-CM | POA: Diagnosis not present

## 2022-08-01 DIAGNOSIS — Z1331 Encounter for screening for depression: Secondary | ICD-10-CM | POA: Diagnosis not present

## 2022-08-01 DIAGNOSIS — Z1339 Encounter for screening examination for other mental health and behavioral disorders: Secondary | ICD-10-CM | POA: Diagnosis not present

## 2022-08-01 DIAGNOSIS — F419 Anxiety disorder, unspecified: Secondary | ICD-10-CM | POA: Diagnosis not present

## 2022-08-01 DIAGNOSIS — R635 Abnormal weight gain: Secondary | ICD-10-CM | POA: Diagnosis not present

## 2022-08-01 DIAGNOSIS — E279 Disorder of adrenal gland, unspecified: Secondary | ICD-10-CM | POA: Diagnosis not present

## 2022-08-15 DIAGNOSIS — E669 Obesity, unspecified: Secondary | ICD-10-CM | POA: Diagnosis not present

## 2022-08-15 DIAGNOSIS — Z789 Other specified health status: Secondary | ICD-10-CM | POA: Diagnosis not present

## 2022-09-04 DIAGNOSIS — E559 Vitamin D deficiency, unspecified: Secondary | ICD-10-CM | POA: Diagnosis not present

## 2022-09-04 DIAGNOSIS — E663 Overweight: Secondary | ICD-10-CM | POA: Diagnosis not present

## 2022-09-16 IMAGING — MG MM DIGITAL SCREENING BILAT W/ TOMO AND CAD
8 series · 8 of 24 positions shown · non-contrast
Comparison: Previous exam(s).

CLINICAL DATA: Screening.

EXAM:
DIGITAL SCREENING BILATERAL MAMMOGRAM WITH TOMOSYNTHESIS AND CAD
TECHNIQUE: Bilateral screening digital craniocaudal and mediolateral oblique
mammograms were obtained. Bilateral screening digital breast
tomosynthesis was performed. The images were evaluated with
computer-aided detection.

[L CC synth-2D]
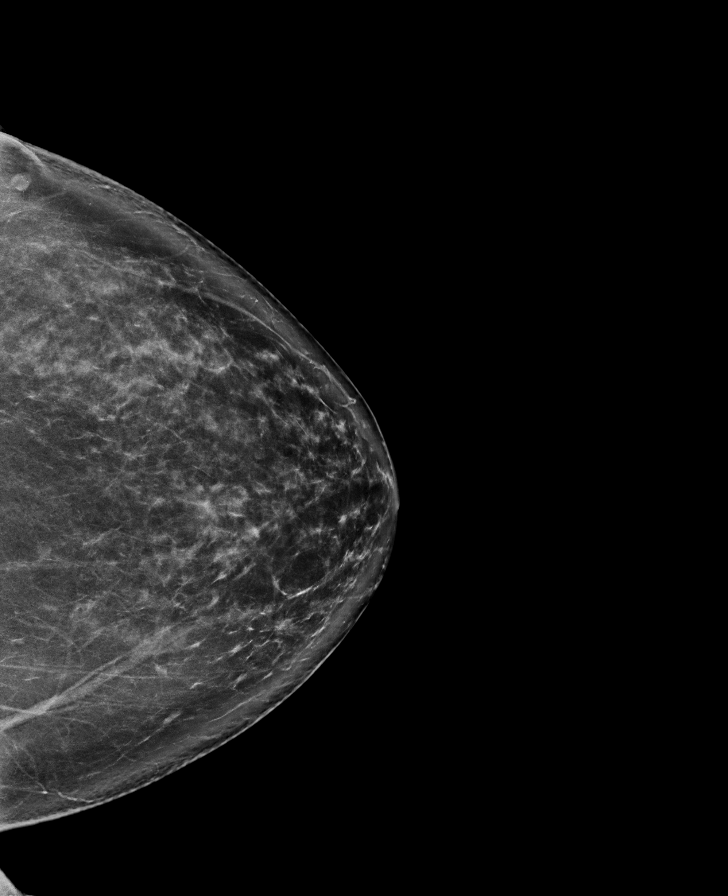

[L MLO synth-2D]
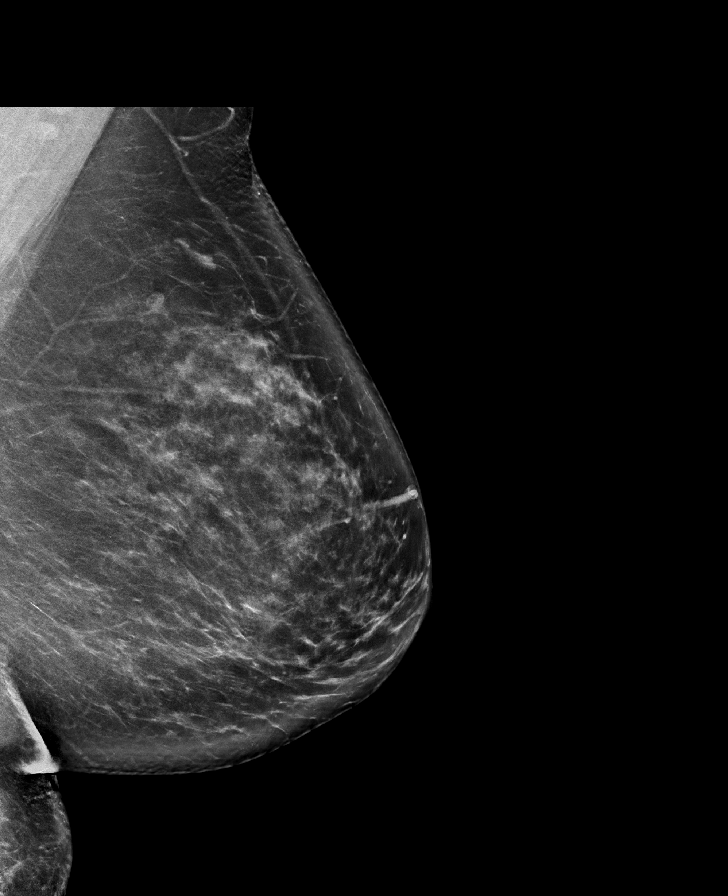

[R MLO synth-2D]
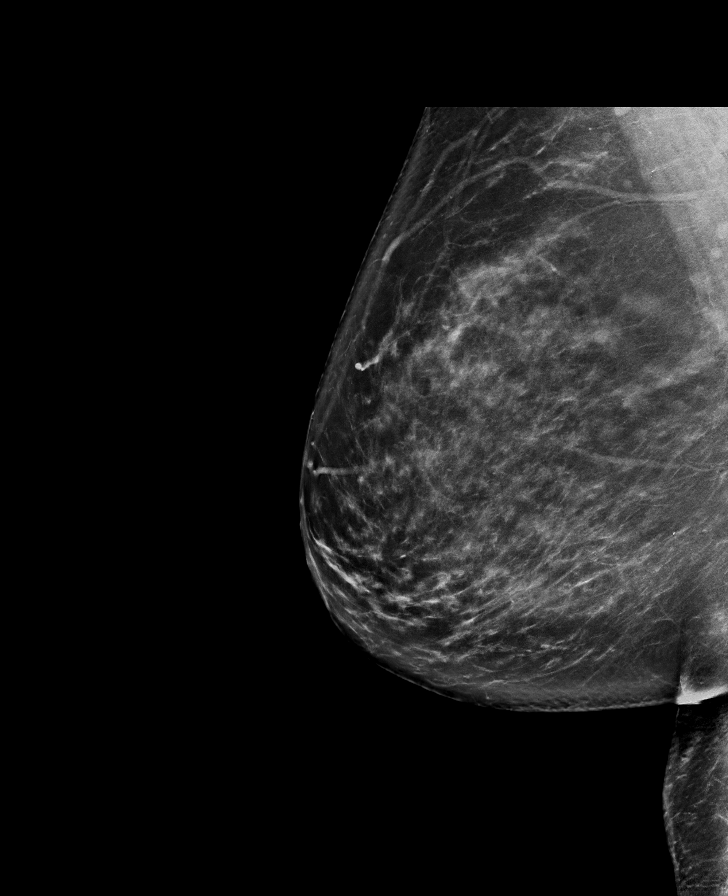

[R CC synth-2D]
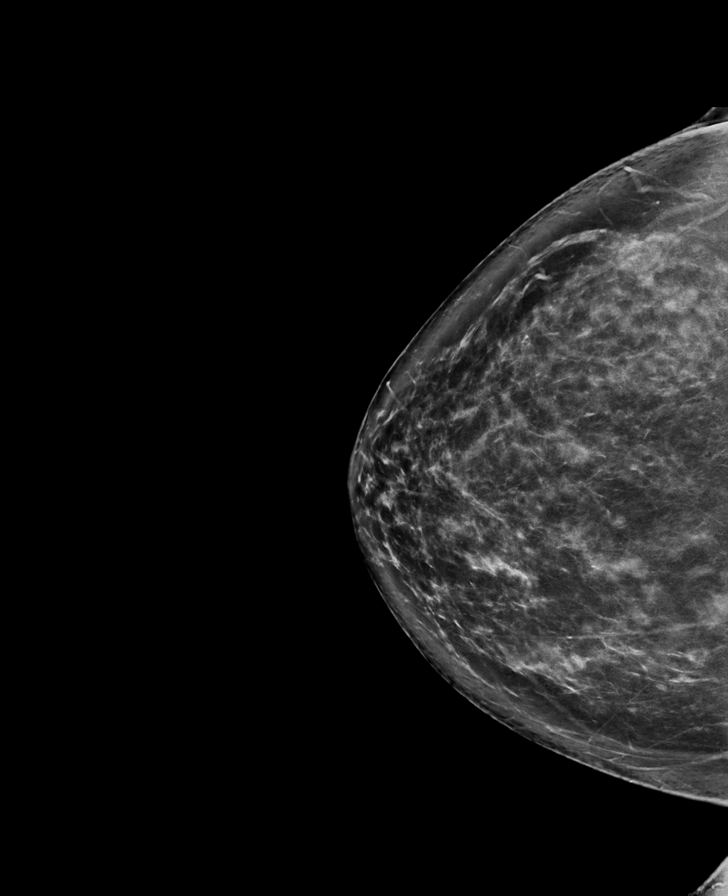

[L MLO tomo · tomo slice 49/97.0]
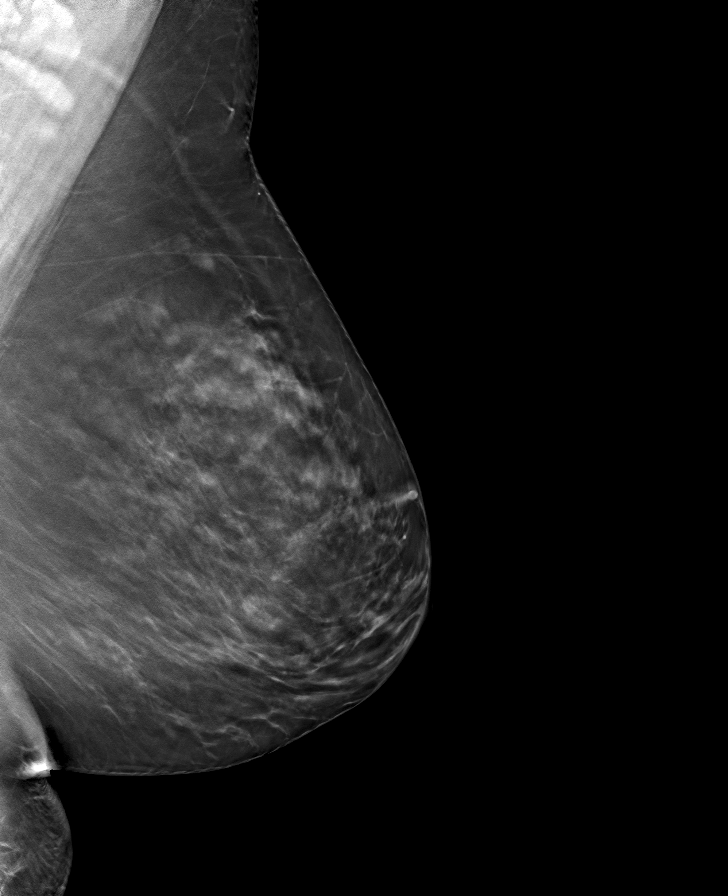

[R CC tomo · tomo slice 45/90.0]
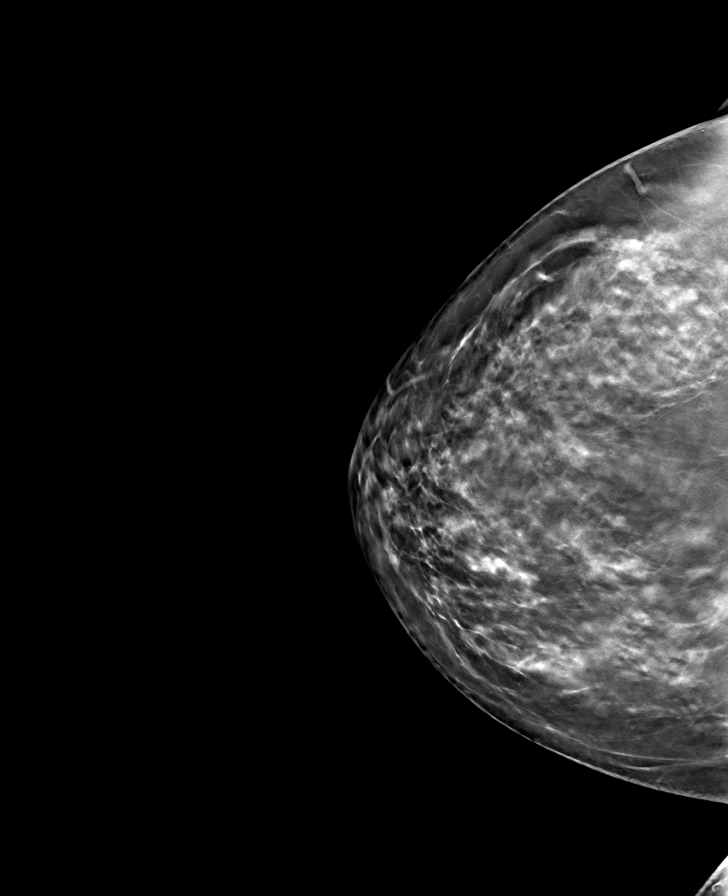

[R MLO tomo · tomo slice 49/97.0]
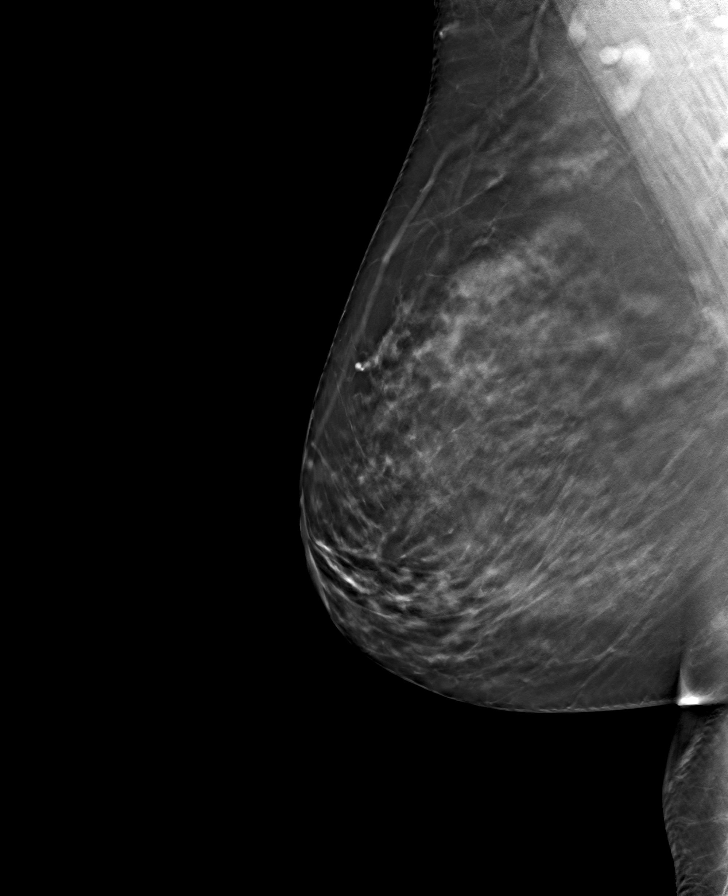

[L CC tomo · tomo slice 46/91.0]
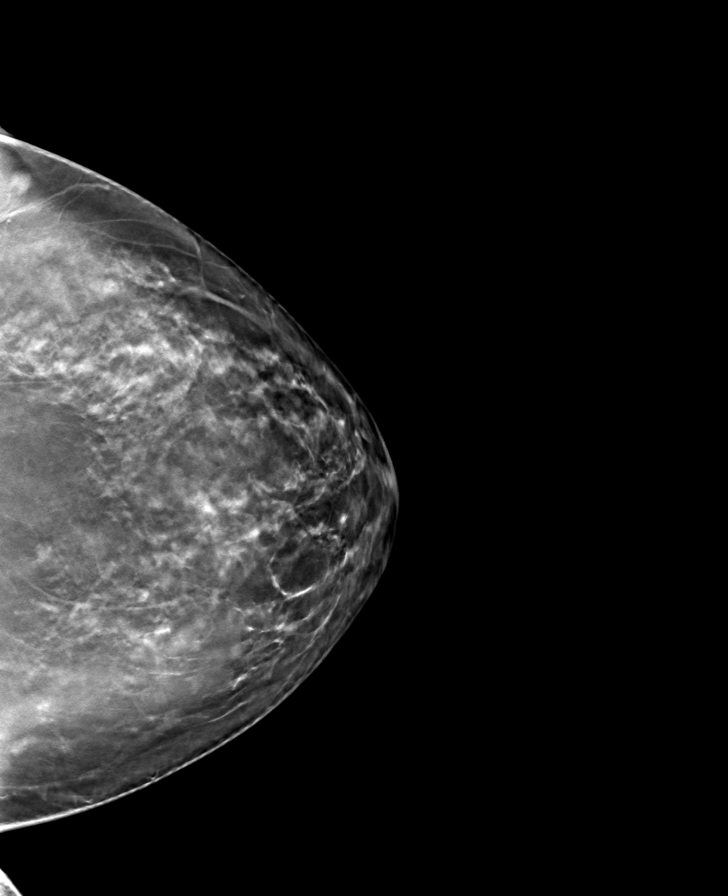

[8 of 24 positions shown; findings below may reference images not displayed]

ACR Breast Density Category c: The breast tissue is heterogeneously
dense, which may obscure small masses.
FINDINGS: There are no findings suspicious for malignancy.
IMPRESSION: No mammographic evidence of malignancy. A result letter of this
screening mammogram will be mailed directly to the patient.

RECOMMENDATION:
Screening mammogram in one year. (Code:Q3-W-BC3)

BI-RADS CATEGORY  1: Negative.

## 2022-09-19 DIAGNOSIS — M2550 Pain in unspecified joint: Secondary | ICD-10-CM | POA: Diagnosis not present

## 2022-09-19 DIAGNOSIS — E663 Overweight: Secondary | ICD-10-CM | POA: Diagnosis not present

## 2022-12-18 ENCOUNTER — Other Ambulatory Visit: Payer: Self-pay | Admitting: Physician Assistant

## 2022-12-18 DIAGNOSIS — Z1231 Encounter for screening mammogram for malignant neoplasm of breast: Secondary | ICD-10-CM

## 2023-01-15 ENCOUNTER — Ambulatory Visit
Admission: RE | Admit: 2023-01-15 | Discharge: 2023-01-15 | Disposition: A | Discharge status: Home / Self Care | Payer: BC Managed Care – PPO | Source: Ambulatory Visit | Attending: Physician Assistant | Admitting: Physician Assistant

## 2023-01-15 DIAGNOSIS — Z1231 Encounter for screening mammogram for malignant neoplasm of breast: Secondary | ICD-10-CM | POA: Insufficient documentation

## 2023-08-14 ENCOUNTER — Encounter: Payer: Self-pay | Admitting: Physician Assistant

## 2023-08-20 ENCOUNTER — Ambulatory Visit: Payer: BC Managed Care – PPO | Admitting: Family Medicine

## 2023-08-20 ENCOUNTER — Encounter: Payer: Self-pay | Admitting: Family Medicine

## 2023-08-20 VITALS — BP 122/75 | HR 80 | Ht 66.0 in | Wt 170.2 lb

## 2023-08-20 DIAGNOSIS — F418 Other specified anxiety disorders: Secondary | ICD-10-CM

## 2023-08-20 DIAGNOSIS — Z1231 Encounter for screening mammogram for malignant neoplasm of breast: Secondary | ICD-10-CM | POA: Diagnosis not present

## 2023-08-20 MED ORDER — PROPRANOLOL HCL 10 MG PO TABS: ORAL_TABLET | ORAL | 11 refills | Status: DC

## 2023-08-20 NOTE — Assessment & Plan Note (Signed)
Chronic, request for PRN BB to assist with symptoms of voice shaking, elevated heart racing, flushing and nervousness Continue to monitor Refills provided

## 2023-08-20 NOTE — Progress Notes (Signed)
Established patient visit   Patient: Sarah Mathis   DOB: 18-Mar-1976   47 y.o. Female  MRN: [id number] Visit Date: 08/20/2023  Today's healthcare provider: Jacky Kindle, FNP  Introduced to nurse practitioner role and practice setting.  All questions answered.  Discussed provider/patient relationship and expectations.  Subjective     Medications: Outpatient Medications Prior to Visit  Medication Sig   levonorgestrel (MIRENA) 20 MCG/24HR IUD 1 each by Intrauterine route once. Insertion October 2019   [DISCONTINUED] propranolol (INDERAL) 10 MG tablet Take 0.5-1 tablets (5-10 mg total) by mouth 3 (three) times daily as needed.   [DISCONTINUED] Semaglutide-Weight Management (WEGOVY) 0.25 MG/0.5ML SOAJ Inject 0.25 mg into the skin once a week.   No facility-administered medications prior to visit.    Review of Systems    Objective    BP 122/75 (BP Location: Right Arm, Patient Position: Sitting, Cuff Size: Normal)   Pulse 80   Ht 5\' 6"  (1.676 m)   Wt 170 lb 3.2 oz (77.2 kg)   SpO2 100%   BMI 27.47 kg/m     Physical Exam Vitals and nursing note reviewed.  Constitutional:      General: She is not in acute distress.    Appearance: Normal appearance. She is overweight. She is not ill-appearing, toxic-appearing or diaphoretic.  HENT:     Head: Normocephalic and atraumatic.  Cardiovascular:     Rate and Rhythm: Normal rate and regular rhythm.     Pulses: Normal pulses.     Heart sounds: Normal heart sounds. No murmur heard.    No friction rub. No gallop.  Pulmonary:     Effort: Pulmonary effort is normal. No respiratory distress.     Breath sounds: Normal breath sounds. No stridor. No wheezing, rhonchi or rales.  Chest:     Chest wall: No tenderness.  Musculoskeletal:        General: No swelling, tenderness, deformity or signs of injury. Normal range of motion.     Right lower leg: No edema.     Left lower leg: No edema.  Skin:    General: Skin is warm and dry.      Capillary Refill: Capillary refill takes less than 2 seconds.     Coloration: Skin is not jaundiced or pale.     Findings: No bruising, erythema, lesion or rash.  Neurological:     General: No focal deficit present.     Mental Status: She is alert and oriented to person, place, and time. Mental status is at baseline.     Cranial Nerves: No cranial nerve deficit.     Sensory: No sensory deficit.     Motor: No weakness.     Coordination: Coordination normal.  Psychiatric:        Mood and Affect: Mood normal.        Behavior: Behavior normal.        Thought Content: Thought content normal.        Judgment: Judgment normal.     No results found for any visits on 08/20/23.  Assessment & Plan     Problem List Items Addressed This Visit       Other   Breast cancer screening by mammogram    Due for screening for mammogram, denies breast concerns, provided with phone number to call and schedule appointment for mammogram. Encouraged to repeat breast cancer screening every 1-2 years.       Relevant Orders   MM 3D SCREENING MAMMOGRAM BILATERAL  BREAST   Situational anxiety - Primary    Chronic, request for PRN BB to assist with symptoms of voice shaking, elevated heart racing, flushing and nervousness Continue to monitor Refills provided      Return if symptoms worsen or fail to improve, for annual examination.     Leilani Merl, FNP, have reviewed all documentation for this visit. The documentation on 08/20/23 for the exam, diagnosis, procedures, and orders are all accurate and complete.  Jacky Kindle, FNP  [facility] [phone] (phone) [phone] (fax)  [facility]

## 2023-08-20 NOTE — Patient Instructions (Signed)
Please call and schedule your mammogram:  [facility]  [address], [address] [facility] [place],  [place]  [address] Get Driving Directions Main: [phone]  Sunday:Closed Monday:7:20 AM - 5:00 PM Tuesday:7:20 AM - 5:00 PM Wednesday:7:20 AM - 5:00 PM Thursday:7:20 AM - 5:00 PM Friday:7:20 AM - 4:30 PM Saturday:Closed

## 2023-08-20 NOTE — Assessment & Plan Note (Signed)
Due for screening for mammogram, denies breast concerns, provided with phone number to call and schedule appointment for mammogram. Encouraged to repeat breast cancer screening every 1-2 years.

## 2023-09-09 DIAGNOSIS — D2272 Melanocytic nevi of left lower limb, including hip: Secondary | ICD-10-CM | POA: Diagnosis not present

## 2023-09-09 DIAGNOSIS — L814 Other melanin hyperpigmentation: Secondary | ICD-10-CM | POA: Diagnosis not present

## 2023-09-09 DIAGNOSIS — D485 Neoplasm of uncertain behavior of skin: Secondary | ICD-10-CM | POA: Diagnosis not present

## 2023-09-09 DIAGNOSIS — D225 Melanocytic nevi of trunk: Secondary | ICD-10-CM | POA: Diagnosis not present

## 2023-09-09 DIAGNOSIS — B358 Other dermatophytoses: Secondary | ICD-10-CM | POA: Diagnosis not present

## 2023-09-09 DIAGNOSIS — L570 Actinic keratosis: Secondary | ICD-10-CM | POA: Diagnosis not present

## 2023-09-09 DIAGNOSIS — B360 Pityriasis versicolor: Secondary | ICD-10-CM | POA: Diagnosis not present

## 2023-10-01 ENCOUNTER — Ambulatory Visit: Payer: BC Managed Care – PPO | Admitting: Family Medicine

## 2023-10-01 ENCOUNTER — Encounter: Payer: Self-pay | Admitting: Family Medicine

## 2023-10-01 ENCOUNTER — Ambulatory Visit
Admission: RE | Admit: 2023-10-01 | Discharge: 2023-10-01 | Disposition: A | Discharge status: Home / Self Care | Payer: BC Managed Care – PPO | Source: Ambulatory Visit | Attending: Family Medicine | Admitting: Family Medicine

## 2023-10-01 ENCOUNTER — Ambulatory Visit
Admission: RE | Admit: 2023-10-01 | Discharge: 2023-10-01 | Disposition: A | Discharge status: Home / Self Care | Payer: BC Managed Care – PPO | Attending: Family Medicine | Admitting: Family Medicine

## 2023-10-01 VITALS — BP 114/77 | HR 68 | Temp 98.2°F | Resp 14 | Ht 66.0 in | Wt 168.4 lb

## 2023-10-01 DIAGNOSIS — G8929 Other chronic pain: Secondary | ICD-10-CM | POA: Diagnosis not present

## 2023-10-01 DIAGNOSIS — M6790 Unspecified disorder of synovium and tendon, unspecified site: Secondary | ICD-10-CM | POA: Diagnosis not present

## 2023-10-01 DIAGNOSIS — M25511 Pain in right shoulder: Secondary | ICD-10-CM

## 2023-10-01 DIAGNOSIS — M779 Enthesopathy, unspecified: Secondary | ICD-10-CM | POA: Diagnosis not present

## 2023-10-01 DIAGNOSIS — M542 Cervicalgia: Secondary | ICD-10-CM | POA: Diagnosis not present

## 2023-10-01 MED ORDER — METHOCARBAMOL 750 MG PO TABS: 750.0000 mg | ORAL_TABLET | Freq: Three times a day (TID) | ORAL | 0 refills | Status: AC | PRN

## 2023-10-01 MED ORDER — MELOXICAM 15 MG PO TABS: 15.0000 mg | ORAL_TABLET | Freq: Every day | ORAL | 0 refills | Status: AC

## 2023-10-01 NOTE — Progress Notes (Signed)
Established patient visit  Patient: Sarah Mathis   DOB: 26-Apr-1976   47 y.o. Female  MRN: [id number] Visit Date: 10/01/2023  Today's healthcare provider: Jacky Kindle, FNP  Patient presents for new patient visit to establish care.  Introduced to Publishing rights manager role and practice setting.  All questions answered.  Discussed provider/patient relationship and expectations.  Chief Complaint  Patient presents with   Shoulder Pain    Right shoulder pain for about a year its gotten worst   Subjective    Shoulder Pain   First noted 1+ years ago; avoids some weight moves at [employer] center, also will use smaller weights. Thought initially had tennis elbow or similar given "bulge" below R anti cubital; however, not improved.   Impaired movement of R arm across body toward L shoulder as well as posterior movement.  No erythema, edema, warmth etc  Pt declines use of oral steroids to assist with inflammation   HPI     Shoulder Pain    Additional comments: Right shoulder pain for about a year its gotten worst      Last edited by Clois Comber on 10/01/2023  2:41 PM.      Medications: Outpatient Medications Prior to Visit  Medication Sig   ketoconazole (NIZORAL) 2 % shampoo Apply 1 Application topically 2 (two) times a week.   levonorgestrel (MIRENA) 20 MCG/24HR IUD 1 each by Intrauterine route once. Insertion October 2019   propranolol (INDERAL) 10 MG tablet Take 5-10 mg 3 times per day as needed for anxiety/palpitations.   No facility-administered medications prior to visit.     Objective    BP 114/77 (BP Location: Right Arm, Patient Position: Sitting, Cuff Size: Normal)   Pulse 68   Temp 98.2 F (36.8 C)   Resp 14   Ht 5\' 6"  (1.676 m)   Wt 168 lb 6.4 oz (76.4 kg)   SpO2 99%   BMI 27.18 kg/m    Physical Exam Vitals and nursing note reviewed.  Constitutional:      General: She is not in acute distress.    Appearance: Normal appearance. She is overweight.  She is not ill-appearing, toxic-appearing or diaphoretic.  HENT:     Head: Normocephalic and atraumatic.  Cardiovascular:     Rate and Rhythm: Normal rate and regular rhythm.     Pulses: Normal pulses.     Heart sounds: Normal heart sounds. No murmur heard.    No friction rub. No gallop.  Pulmonary:     Effort: Pulmonary effort is normal. No respiratory distress.     Breath sounds: Normal breath sounds. No stridor. No wheezing, rhonchi or rales.  Chest:     Chest wall: No tenderness.  Abdominal:     Palpations: Abdomen is soft.  Musculoskeletal:        General: No swelling, deformity or signs of injury. Normal range of motion.     Cervical back: Tenderness present.     Right lower leg: No edema.     Left lower leg: No edema.  Skin:    General: Skin is warm and dry.     Capillary Refill: Capillary refill takes less than 2 seconds.     Coloration: Skin is not jaundiced or pale.     Findings: Lesion present. No bruising, erythema or rash.     Comments: Cyst like lesion below R anti-cubital; present 1+ years. Worsening weakness and pain in R arm iso gym routine   Neurological:  General: No focal deficit present.     Mental Status: She is alert and oriented to person, place, and time. Mental status is at baseline.     Cranial Nerves: No cranial nerve deficit.     Sensory: No sensory deficit.     Motor: No weakness.     Coordination: Coordination normal.  Psychiatric:        Mood and Affect: Mood normal.        Behavior: Behavior normal.        Thought Content: Thought content normal.        Judgment: Judgment normal.     No results found for any visits on 10/01/23.  Assessment & Plan     Problem List Items Addressed This Visit       Other   Acute pain of right shoulder - Primary    Acute on chronic, recurrent Worsening d/t gym routine Poor strength but no focal weakness No neuropathic pain or tingling  Trial of NSAID mobic 15 and robaxin as needed; Continue ice to  assist and stretching       Relevant Medications   meloxicam (MOBIC) 15 MG tablet   methocarbamol (ROBAXIN-750) 750 MG tablet   Other Relevant Orders   DG Shoulder Right   DG Cervical Spine Complete   Ambulatory referral to Sports Medicine   Other Visit Diagnoses     Tendinopathy       Relevant Medications   meloxicam (MOBIC) 15 MG tablet   methocarbamol (ROBAXIN-750) 750 MG tablet   Other Relevant Orders   DG Shoulder Right   DG Cervical Spine Complete   Ambulatory referral to Sports Medicine      Return if symptoms worsen or fail to improve.     Leilani Merl, FNP, have reviewed all documentation for this visit. The documentation on 10/01/23 for the exam, diagnosis, procedures, and orders are all accurate and complete.  Jacky Kindle, FNP  [facility] [phone] (phone) [phone] (fax)  [facility]

## 2023-10-01 NOTE — Assessment & Plan Note (Addendum)
Acute on chronic, recurrent Worsening d/t gym routine Poor strength but no focal weakness No neuropathic pain or tingling  Trial of NSAID mobic 15 and robaxin as needed; Continue ice to assist and stretching

## 2023-10-06 ENCOUNTER — Ambulatory Visit: Payer: BC Managed Care – PPO | Admitting: Family Medicine

## 2023-10-06 ENCOUNTER — Other Ambulatory Visit: Payer: Self-pay

## 2023-10-06 ENCOUNTER — Encounter: Payer: Self-pay | Admitting: Family Medicine

## 2023-10-06 VITALS — BP 122/70 | Ht 66.0 in | Wt 165.0 lb

## 2023-10-06 DIAGNOSIS — M25511 Pain in right shoulder: Secondary | ICD-10-CM

## 2023-10-06 NOTE — Progress Notes (Addendum)
PCP: Jacky Kindle, FNP  Chief Complaint: Sh Subjective:   HPI: Patient is a 47 y.o. female here for right shoulder pain.  Patient states that her shoulder has been bothering her for about a year and a half now but now its become to the point where she has difficulty doing some movements.  Patient states that she is quite active in the gym with strength training and notes that a lot of overhead pressing and push-ups is becoming difficult.  Patient denies any recent trauma or injury that she can remember.  Patient is unsure with specific movements that she has difficulty with and sometimes its random.  Patient might have problem with raising her arm against resistance or if she is externally rotating.  Patient states that sometimes the pain is not as bad as other times.  Patient's not tried anything for the pain yet for any rehab..   Past Medical History:  Diagnosis Date   No pertinent past medical history     Current Outpatient Medications on File Prior to Visit  Medication Sig Dispense Refill   ketoconazole (NIZORAL) 2 % shampoo Apply 1 Application topically 2 (two) times a week.     levonorgestrel (MIRENA) 20 MCG/24HR IUD 1 each by Intrauterine route once. Insertion October 2019     meloxicam (MOBIC) 15 MG tablet Take 1 tablet (15 mg total) by mouth daily. 90 tablet 0   methocarbamol (ROBAXIN-750) 750 MG tablet Take 1 tablet (750 mg total) by mouth every 8 (eight) hours as needed for muscle spasms. 90 tablet 0   propranolol (INDERAL) 10 MG tablet Take 5-10 mg 3 times per day as needed for anxiety/palpitations. 30 tablet 11   No current facility-administered medications on file prior to visit.    Past Surgical History:  Procedure Laterality Date   NO PAST SURGERIES      No Known Allergies  BP 122/70   Ht 5\' 6"  (1.676 m)   Wt 165 lb (74.8 kg)   BMI 26.63 kg/m       No data to display              No data to display              Objective:  Physical Exam:  Gen:  NAD, comfortable in exam room  Shoulder, Right:. No  skin changes, erythema, or ecchymosis noted. No evidence of bony deformity, asymmetry, or muscle atrophy; No tenderness over long head of biceps (bicipital groove). No TTP at [facility] joint. Full active and passive range of motion (180 flex Elisabeth Most /150Abd /90ER /70IR), Thumb to T12 with mild difficulty Strength 5/5 throughout. No abnormal scapular function observed. Sensation to light touch intact. Peripheral pulses intact. DTR's .   Special Tests:   - Painful Arc absent     - Empty can: POS   - Int/Ext Rotation test: NEG/POS   - Gerber Lift-Off Test: NEG   - Crossarm Adduction test: NEG   - Hawkins: NEG   - Neer test: POS   - O'brien's test: POS   - Yergason's: NEG   - Speeds test: NEG  Limited MSK U/S Rt shoulder: There is noted scar tissue of the supraspinatus.  There is some mild hypoechoic changes the supraspinatus tendon Impression: Previous injury of the supraspinatus with scar tissue formation and tendinopathy. Degenerative changes of AC joint   Assessment & Plan:  1. 1. Acute pain of right shoulder - Patients u/s does show some previous scar tissue of the  supraspinatus as well as some degenerative changes of the AC joint. Patients would benefit from rotator cuff series plus anti inflammatories at this time.  - Recommend meloxicam daily for the next 14 which patient had already prescribed to her from her PCP - [place] COMPLETE JOINT SPACE STRUCTURES UP RIGHT; Future    Brenton Grills MD, PGY-4  Sports Medicine [facility]  Addendum:  Patient seen in the office by fellow.  History, exam, ultrasound images, plan of care were precepted with me.   Darene Lamer, DO, CAQSM

## 2023-10-08 ENCOUNTER — Encounter: Payer: Self-pay | Admitting: Family Medicine

## 2023-10-25 NOTE — Progress Notes (Signed)
Shoulder and neck imaging shows -tendinitis as we expected as well as slight spurring at the cervical joint C5/C6.   Continue to monitor symptoms and avoid overuse/heavy use.

## 2024-01-19 ENCOUNTER — Ambulatory Visit
Admission: RE | Admit: 2024-01-19 | Discharge: 2024-01-19 | Disposition: A | Discharge status: Home / Self Care | Payer: BC Managed Care – PPO | Source: Ambulatory Visit | Attending: Family Medicine | Admitting: Family Medicine

## 2024-01-19 DIAGNOSIS — Z1231 Encounter for screening mammogram for malignant neoplasm of breast: Secondary | ICD-10-CM | POA: Insufficient documentation

## 2024-01-20 DIAGNOSIS — D485 Neoplasm of uncertain behavior of skin: Secondary | ICD-10-CM | POA: Diagnosis not present

## 2024-01-20 DIAGNOSIS — L814 Other melanin hyperpigmentation: Secondary | ICD-10-CM | POA: Diagnosis not present

## 2024-01-20 DIAGNOSIS — D2272 Melanocytic nevi of left lower limb, including hip: Secondary | ICD-10-CM | POA: Diagnosis not present

## 2024-01-20 DIAGNOSIS — L821 Other seborrheic keratosis: Secondary | ICD-10-CM | POA: Diagnosis not present

## 2024-01-20 DIAGNOSIS — D225 Melanocytic nevi of trunk: Secondary | ICD-10-CM | POA: Diagnosis not present

## 2024-01-20 DIAGNOSIS — D2271 Melanocytic nevi of right lower limb, including hip: Secondary | ICD-10-CM | POA: Diagnosis not present

## 2024-01-21 ENCOUNTER — Encounter: Payer: Self-pay | Admitting: Family Medicine

## 2024-02-05 ENCOUNTER — Telehealth: Payer: Self-pay | Admitting: Family Medicine

## 2024-02-05 DIAGNOSIS — M25511 Pain in right shoulder: Secondary | ICD-10-CM

## 2024-02-05 DIAGNOSIS — M6790 Unspecified disorder of synovium and tendon, unspecified site: Secondary | ICD-10-CM

## 2024-02-05 NOTE — Telephone Encounter (Signed)
 [facility] faxed refill request for the following medications:  meloxicam (MOBIC) 15 MG tablet   Please advise.

## 2024-02-05 NOTE — Telephone Encounter (Signed)
 LOV:10/01/2023  NOV: None  LR: 10/01/2023 qty:90 r:0  Patient needs an appointment. She was last seen by a provider that is no longer in the practice. Patient needs appointment with another provider in the office.

## 2024-08-10 DIAGNOSIS — Z08 Encounter for follow-up examination after completed treatment for malignant neoplasm: Secondary | ICD-10-CM | POA: Diagnosis not present

## 2024-08-10 DIAGNOSIS — L814 Other melanin hyperpigmentation: Secondary | ICD-10-CM | POA: Diagnosis not present

## 2024-08-10 DIAGNOSIS — L821 Other seborrheic keratosis: Secondary | ICD-10-CM | POA: Diagnosis not present

## 2024-08-10 DIAGNOSIS — Z85828 Personal history of other malignant neoplasm of skin: Secondary | ICD-10-CM | POA: Diagnosis not present

## 2024-11-01 ENCOUNTER — Telehealth: Payer: Self-pay | Admitting: Family Medicine

## 2024-11-01 ENCOUNTER — Other Ambulatory Visit: Payer: Self-pay

## 2024-11-01 DIAGNOSIS — F418 Other specified anxiety disorders: Secondary | ICD-10-CM

## 2024-11-01 MED ORDER — PROPRANOLOL HCL 10 MG PO TABS: ORAL_TABLET | ORAL | 0 refills | Status: AC

## 2024-11-01 NOTE — Telephone Encounter (Signed)
 Converted to rx request

## 2024-11-01 NOTE — Telephone Encounter (Signed)
 [facility] faxed refill request for the following medications:   propranolol  (INDERAL ) 10 MG tablet    Please advise.

## 2025-02-17 ENCOUNTER — Encounter: Admitting: Physician Assistant
